# Patient Record
Sex: Female | Born: 1975 | Race: White | Hispanic: No | Marital: Single | Smoking: Current every day smoker
Health system: Southern US, Community
[De-identification: ages and names within clinical notes are randomized; demographics above are authoritative.]

## PROBLEM LIST (undated history)

## (undated) DIAGNOSIS — J45909 Unspecified asthma, uncomplicated: Secondary | ICD-10-CM

## (undated) HISTORY — PX: APPENDECTOMY: SHX54

## (undated) HISTORY — PX: TUBAL LIGATION: SHX77

## (undated) HISTORY — PX: HAND SURGERY: SHX662

## (undated) HISTORY — PX: TONSILLECTOMY: SUR1361

---

## 2015-04-24 ENCOUNTER — Encounter (HOSPITAL_COMMUNITY): Payer: Self-pay | Admitting: Emergency Medicine

## 2015-04-24 ENCOUNTER — Emergency Department (HOSPITAL_COMMUNITY): Payer: Medicaid Other

## 2015-04-24 ENCOUNTER — Emergency Department (HOSPITAL_COMMUNITY)
Admission: EM | Admit: 2015-04-24 | Discharge: 2015-04-24 | Disposition: A | Discharge status: Home / Self Care | Payer: Medicaid Other | Attending: Emergency Medicine | Admitting: Emergency Medicine

## 2015-04-24 DIAGNOSIS — S01111A Laceration without foreign body of right eyelid and periocular area, initial encounter: Secondary | ICD-10-CM | POA: Diagnosis not present

## 2015-04-24 DIAGNOSIS — S161XXA Strain of muscle, fascia and tendon at neck level, initial encounter: Secondary | ICD-10-CM | POA: Diagnosis not present

## 2015-04-24 DIAGNOSIS — S299XXA Unspecified injury of thorax, initial encounter: Secondary | ICD-10-CM | POA: Insufficient documentation

## 2015-04-24 DIAGNOSIS — S0993XA Unspecified injury of face, initial encounter: Secondary | ICD-10-CM | POA: Diagnosis present

## 2015-04-24 DIAGNOSIS — S098XXA Other specified injuries of head, initial encounter: Secondary | ICD-10-CM

## 2015-04-24 DIAGNOSIS — S0083XA Contusion of other part of head, initial encounter: Secondary | ICD-10-CM

## 2015-04-24 DIAGNOSIS — J45909 Unspecified asthma, uncomplicated: Secondary | ICD-10-CM | POA: Diagnosis not present

## 2015-04-24 DIAGNOSIS — S20219A Contusion of unspecified front wall of thorax, initial encounter: Secondary | ICD-10-CM | POA: Diagnosis not present

## 2015-04-24 DIAGNOSIS — Y9389 Activity, other specified: Secondary | ICD-10-CM | POA: Diagnosis not present

## 2015-04-24 DIAGNOSIS — Y9289 Other specified places as the place of occurrence of the external cause: Secondary | ICD-10-CM | POA: Diagnosis not present

## 2015-04-24 DIAGNOSIS — Y998 Other external cause status: Secondary | ICD-10-CM | POA: Insufficient documentation

## 2015-04-24 HISTORY — DX: Unspecified asthma, uncomplicated: J45.909

## 2015-04-24 MED ORDER — MORPHINE SULFATE (PF) 4 MG/ML IV SOLN
INTRAVENOUS | Status: AC
  Filled 2015-04-24: qty 1

## 2015-04-24 MED ORDER — MORPHINE SULFATE (PF) 4 MG/ML IV SOLN
4.0000 mg | Freq: Once | INTRAVENOUS | Status: AC
  Administered 2015-04-24: 4 mg via INTRAVENOUS
  Filled 2015-04-24: qty 1

## 2015-04-24 MED ORDER — MORPHINE SULFATE (PF) 4 MG/ML IV SOLN
4.0000 mg | Freq: Once | INTRAVENOUS | Status: AC
  Administered 2015-04-24: 4 mg via INTRAVENOUS

## 2015-04-24 MED ORDER — ONDANSETRON 4 MG PO TBDP
8.0000 mg | ORAL_TABLET | Freq: Once | ORAL | Status: AC
Start: 2015-04-24 — End: 2015-04-24
  Administered 2015-04-24: 8 mg via ORAL
  Filled 2015-04-24: qty 2

## 2015-04-24 MED ORDER — LIDOCAINE-EPINEPHRINE (PF) 2 %-1:200000 IJ SOLN
10.0000 mL | Freq: Once | INTRAMUSCULAR | Status: AC
  Administered 2015-04-24: 10 mL via INTRADERMAL
  Filled 2015-04-24: qty 20

## 2015-04-24 MED ORDER — HYDROCODONE-ACETAMINOPHEN 5-325 MG PO TABS: 1.0000 | ORAL_TABLET | Freq: Four times a day (QID) | ORAL | Status: AC | PRN

## 2015-04-24 NOTE — ED Notes (Signed)
Son coming from [place] to pick patient up.  Will be here after 8:30.

## 2015-04-24 NOTE — ED Notes (Signed)
Dr. Delo at the bedside 
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
